# Patient Record
Sex: Male | Born: 1980 | Race: White | Hispanic: No
Health system: Southern US, Community
[De-identification: ages and names within clinical notes are randomized; demographics above are authoritative.]

## PROBLEM LIST (undated history)

## (undated) DIAGNOSIS — N200 Calculus of kidney: Secondary | ICD-10-CM

---
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
## 2010-09-02 HISTORY — PX: BICEPS TENDON REPAIR: SHX566

## 2016-07-11 ENCOUNTER — Emergency Department (HOSPITAL_BASED_OUTPATIENT_CLINIC_OR_DEPARTMENT_OTHER): Payer: Self-pay

## 2016-07-11 ENCOUNTER — Encounter (HOSPITAL_BASED_OUTPATIENT_CLINIC_OR_DEPARTMENT_OTHER): Payer: Self-pay

## 2016-07-11 ENCOUNTER — Emergency Department (HOSPITAL_BASED_OUTPATIENT_CLINIC_OR_DEPARTMENT_OTHER)
Admission: EM | Admit: 2016-07-11 | Discharge: 2016-07-12 | Disposition: A | Discharge status: Home / Self Care | Payer: Self-pay | Attending: Emergency Medicine | Admitting: Emergency Medicine

## 2016-07-11 DIAGNOSIS — W11XXXA Fall on and from ladder, initial encounter: Secondary | ICD-10-CM | POA: Insufficient documentation

## 2016-07-11 DIAGNOSIS — Y939 Activity, unspecified: Secondary | ICD-10-CM | POA: Insufficient documentation

## 2016-07-11 DIAGNOSIS — Y9289 Other specified places as the place of occurrence of the external cause: Secondary | ICD-10-CM | POA: Insufficient documentation

## 2016-07-11 DIAGNOSIS — Y999 Unspecified external cause status: Secondary | ICD-10-CM | POA: Insufficient documentation

## 2016-07-11 DIAGNOSIS — S4991XA Unspecified injury of right shoulder and upper arm, initial encounter: Secondary | ICD-10-CM | POA: Insufficient documentation

## 2016-07-11 HISTORY — DX: Calculus of kidney: N20.0

## 2016-07-11 NOTE — ED Triage Notes (Signed)
Pt fell off a 13 foot ladder onto a carpeted floor and landed on his right side with his right arm extended laterally.  Initially, he was unable to move his arm and shoulder, then he was able to pull it down and felt a pop.  Has a bulge to his right side clavicle, no deformity to shoulder.  No tenderness to ribs, no SOB, no LOC.

## 2016-07-11 NOTE — ED Provider Notes (Signed)
[facility] Provider Note: Lowella DellJ. Lane Leena Tiede, MD, FACEP  CSN: [id number] MRN: [id number] ARRIVAL: 07/11/16 at 2313 ROOM: [facility]   CHIEF COMPLAINT  Fall   HISTORY OF PRESENT ILLNESS  Lawrence Pratt is a 35 y.o. male presents to the Emergency Department for right shoulder pain s/p a fall that occurred PTA. He reports associated right clavicle pain and A firm knot to his mid right clavicle. Pt reports that he fell off of a 13 foot ladder onto a carpeted floor and landed on his right side. Pt notes feeling a "pop" to his right shoulder and is unsure if he may have dislocated it. He did not hit his head or lose consciousness. He denies neck pain, abdominal pain, shortness of breath, rib pain, or lower extremity pain.    Past Medical History:  Diagnosis Date  . Kidney stones     Past Surgical History:  Procedure Laterality Date  . BICEPS TENDON REPAIR Right 2012  . HERNIA REPAIR Right 1999   inguinal    No family history on file.  Social History  Substance Use Topics  . Smoking status: Not on file  . Smokeless tobacco: Not on file  . Alcohol use Not on file    Prior to Admission medications   Not on File    Allergies Tramadol   REVIEW OF SYSTEMS  Negative except as noted here or in the History of Present Illness.   PHYSICAL EXAMINATION  Initial Vital Signs Blood pressure 139/98, pulse 95, temperature 98.5 F (36.9 C), temperature source Oral, resp. rate 18, height 5\' 10"  (1.778 m), weight 165 lb (74.8 kg), SpO2 99 %.  Examination General: Well-developed, well-nourished male in no acute distress; appearance consistent with age of record HENT: normocephalic; atraumatic Eyes: pupils equal, round and reactive to light; extraocular muscles intact Neck: supple; no C-spine tenderness Heart: regular rate and rhythm Lungs: clear to auscultation bilaterally Abdomen: soft; nondistended; nontender Back: Note spinal tenderness Extremities: No deformity; full  range of motion except right shoulder secondary to pain; pulses normal; deformity and tenderness of mid right clavicle, mild tenderness of right shoulder and right scapula; RUE is distally neurovascularly intact with intact tendon function Neurologic: Awake, alert and oriented; motor function intact in all extremities and symmetric; no facial droop Skin: Warm and dry Psychiatric: Normal mood and affect   RESULTS  Summary of this visit's results, reviewed by myself:   EKG Interpretation  Date/Time:    Ventricular Rate:    PR Interval:    QRS Duration:   QT Interval:    QTC Calculation:   R Axis:     Text Interpretation:        Laboratory Studies: No results found for this or any previous visit (from the past 24 hour(s)). Imaging Studies: Dg Clavicle Right  Result Date: 07/11/2016 CLINICAL DATA:  Initial evaluation for acute trauma.  The fall. EXAM: RIGHT CLAVICLE - 2+ VIEWS COMPARISON:  None. FINDINGS: No acute fracture or dislocation identified. Acromioclavicular and sternoclavicular joints are approximated. A small cortically based lucency noted within the superior aspect of the mid right clavicular shaft, of doubtful clinical significance. Osseous mineralization within normal limits. No acute abnormality about the visualized shoulder. Visualized right hemi thorax is clear. IMPRESSION: 1. No acute osseous abnormality about the right clavicle. 2. Sub cm cortically based lucency at the superior aspect of the mid right clavicular shaft, indeterminate, but felt to be of doubtful clinical significance. Electronically Signed   By: Janell QuietBenjamin  McClintock M.D.  On: 07/11/2016 23:50    ED COURSE  Nursing notes and initial vitals signs, including pulse oximetry, reviewed.  Vitals:   07/11/16 2322  BP: 139/98  Pulse: 95  Resp: 18  Temp: 98.5 F (36.9 C)  TempSrc: Oral  SpO2: 99%  Weight: 165 lb (74.8 kg)  Height: 5\' 10"  (1.778 m)    PROCEDURES    ED DIAGNOSES     ICD-9-CM  ICD-10-CM   1. Fall from ladder, initial encounter E881.0 W11.XXXA   2. Injury of clavicle, right, initial encounter 959.2 S49.91XA     I personally performed the services described in this documentation, which was scribed in my presence. The recorded information has been reviewed and is accurate.    Paula LibraJohn Kellianne Ek, MD 07/12/16 0010

## 2016-07-12 MED ORDER — HYDROCODONE-ACETAMINOPHEN 5-325 MG PO TABS: 1.0000 | ORAL_TABLET | Freq: Four times a day (QID) | ORAL | 0 refills | Status: AC | PRN

## 2016-07-12 NOTE — ED Notes (Signed)
Pt verbalizes understanding of d/c instructions and denies any further needs at this time. 

## 2017-08-10 IMAGING — CR DG CLAVICLE*R*
2 series · 2 of 2 positions shown · non-contrast
Comparison: None.

CLINICAL DATA: Initial evaluation for acute trauma.  The fall.

EXAM:
RIGHT CLAVICLE - 2+ VIEWS

[w clavicle ap right *]
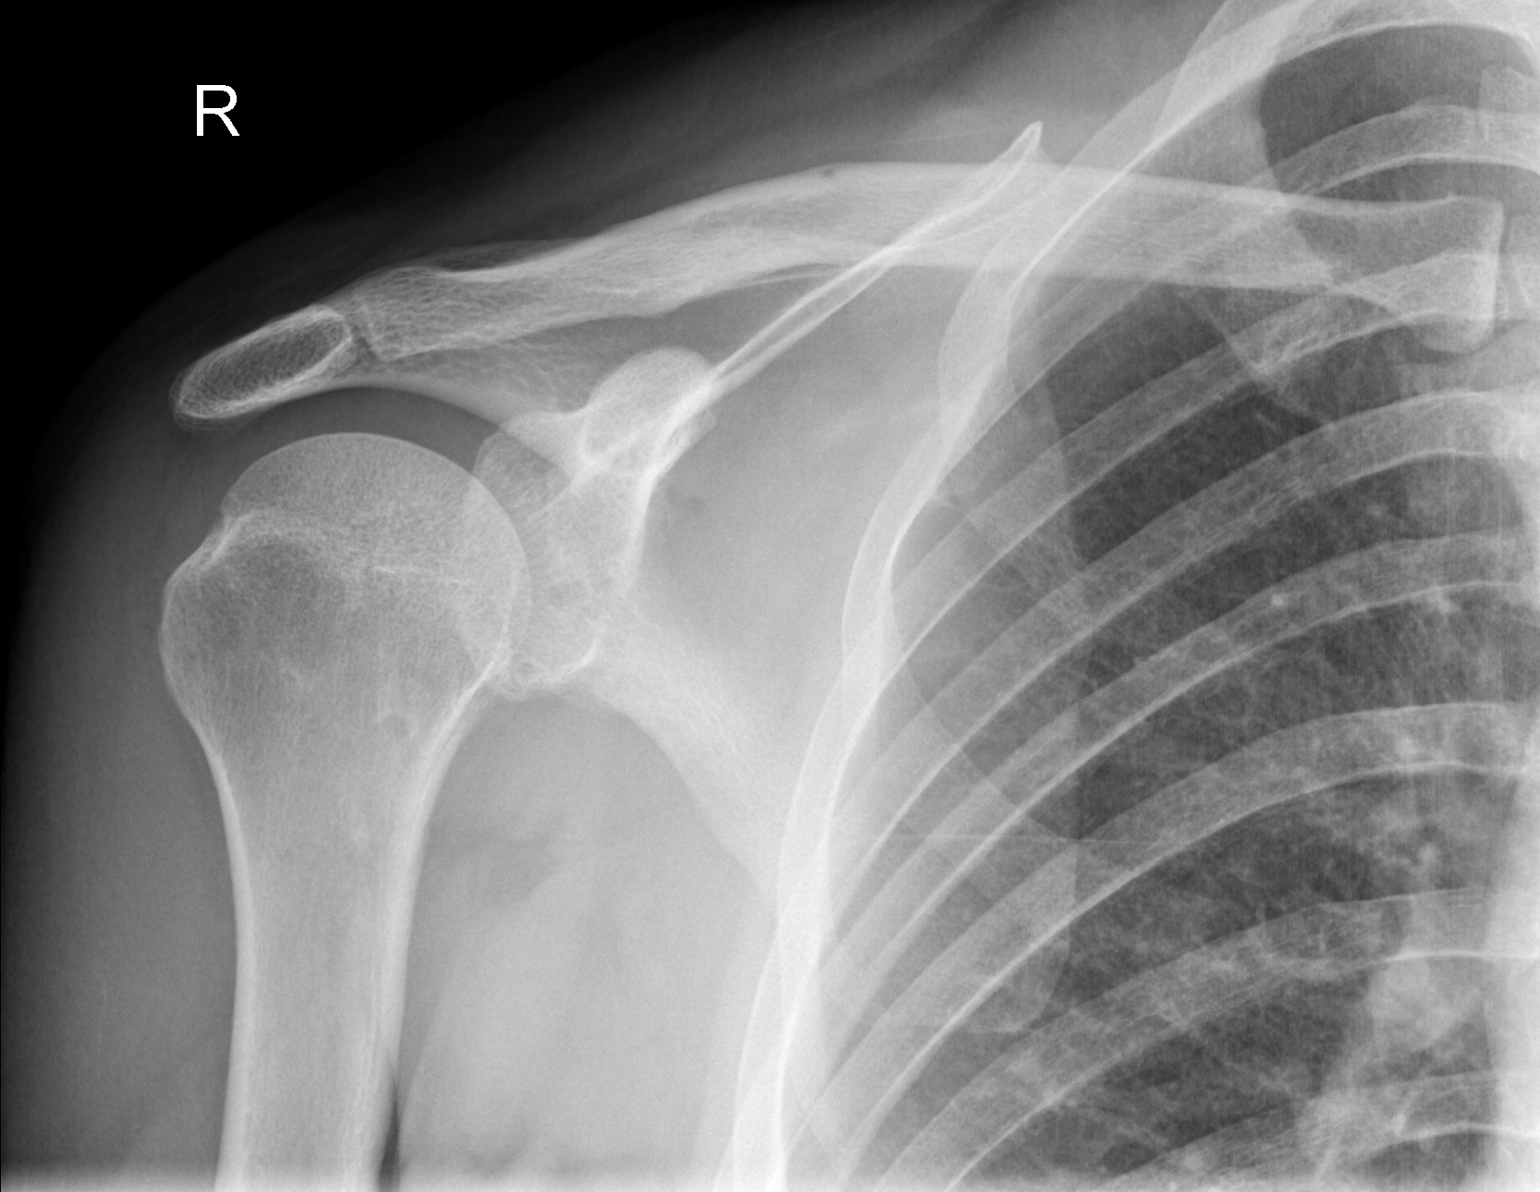

[w clavicle tangential right *]
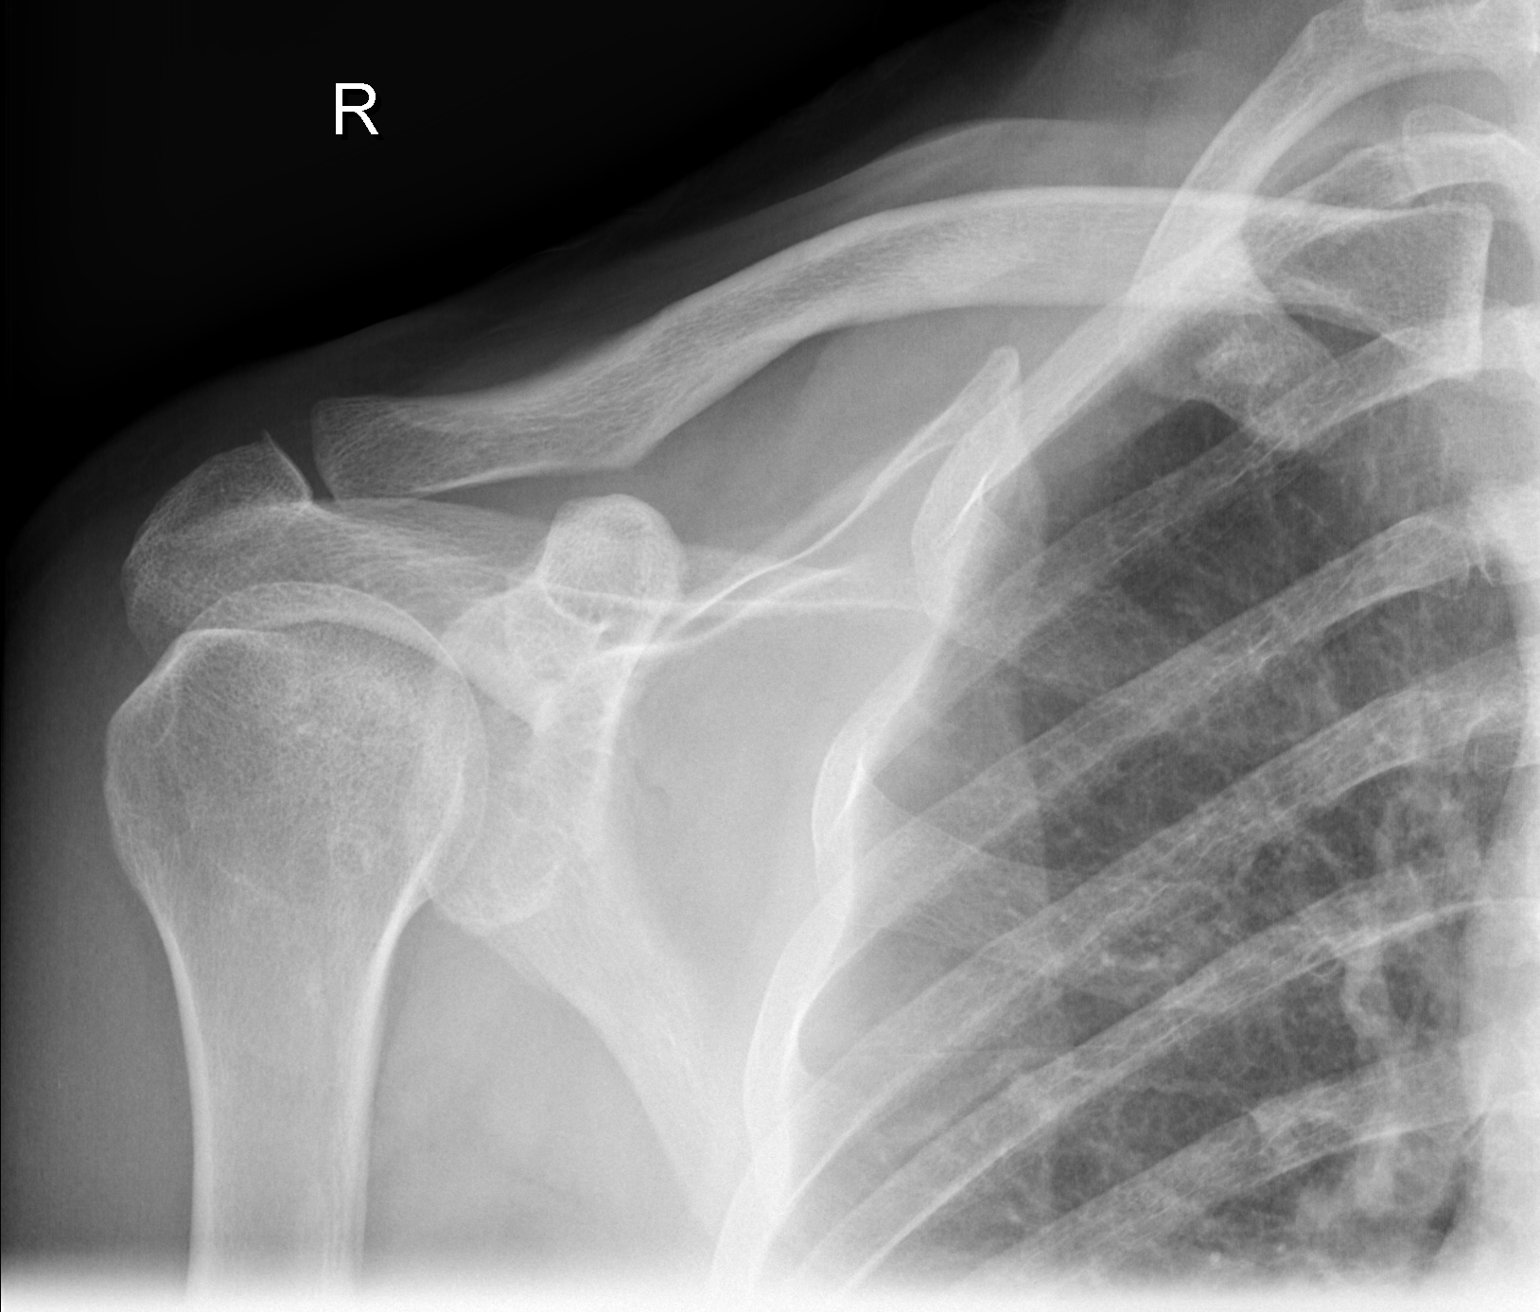

[2 of 2 positions shown; findings below may reference images not displayed]

FINDINGS: No acute fracture or dislocation identified. Acromioclavicular and
sternoclavicular joints are approximated. A small cortically based
lucency noted within the superior aspect of the mid right clavicular
shaft, of doubtful clinical significance. Osseous mineralization
within normal limits. No acute abnormality about the visualized
shoulder. Visualized right hemi thorax is clear.
IMPRESSION: 1. No acute osseous abnormality about the right clavicle.
2. Sub cm cortically based lucency at the superior aspect of the mid
right clavicular shaft, indeterminate, but felt to be of doubtful
clinical significance.
